# Patient Record
Sex: Male | Born: 1985 | Race: White | Hispanic: No | Marital: Single | Smoking: Current every day smoker
Health system: Southern US, Community
[De-identification: ages and names within clinical notes are randomized; demographics above are authoritative.]

## PROBLEM LIST (undated history)

## (undated) DIAGNOSIS — F1010 Alcohol abuse, uncomplicated: Secondary | ICD-10-CM

---

## 2014-04-30 ENCOUNTER — Encounter (HOSPITAL_COMMUNITY): Payer: Self-pay | Admitting: Emergency Medicine

## 2014-04-30 ENCOUNTER — Emergency Department (HOSPITAL_COMMUNITY)
Admission: EM | Admit: 2014-04-30 | Discharge: 2014-04-30 | Disposition: A | Discharge status: Home / Self Care | Payer: Self-pay | Attending: Emergency Medicine | Admitting: Emergency Medicine

## 2014-04-30 ENCOUNTER — Emergency Department (HOSPITAL_COMMUNITY): Payer: Self-pay

## 2014-04-30 DIAGNOSIS — R000 Tachycardia, unspecified: Secondary | ICD-10-CM | POA: Insufficient documentation

## 2014-04-30 DIAGNOSIS — R569 Unspecified convulsions: Secondary | ICD-10-CM | POA: Insufficient documentation

## 2014-04-30 DIAGNOSIS — F17200 Nicotine dependence, unspecified, uncomplicated: Secondary | ICD-10-CM | POA: Insufficient documentation

## 2014-04-30 HISTORY — DX: Alcohol abuse, uncomplicated: F10.10

## 2014-04-30 LAB — BASIC METABOLIC PANEL
Anion gap: 19 — ABNORMAL HIGH (ref 5–15)
BUN: 6 mg/dL (ref 6–23)
CO2: 20 mEq/L (ref 19–32)
Calcium: 9.3 mg/dL (ref 8.4–10.5)
Chloride: 99 mEq/L (ref 96–112)
Creatinine, Ser: 0.76 mg/dL (ref 0.50–1.35)
GFR calc Af Amer: >90 mL/min (ref >90)
GFR calc non Af Amer: >90 mL/min (ref >90)
Glucose, Bld: 96 mg/dL (ref 70–99)
Potassium: 3.8 mEq/L (ref 3.7–5.3)
Sodium: 138 mEq/L (ref 137–147)

## 2014-04-30 LAB — RAPID URINE DRUG SCREEN, HOSP PERFORMED
Amphetamines: NOT DETECTED
Barbiturates: NOT DETECTED
Benzodiazepines: NOT DETECTED
Cocaine: NOT DETECTED
Opiates: NOT DETECTED
Tetrahydrocannabinol: NOT DETECTED

## 2014-04-30 LAB — CBC WITH DIFFERENTIAL/PLATELET
Basophils Absolute: 0.1 10*3/uL (ref 0.0–0.1)
Basophils Relative: 1 % (ref 0–1)
Eosinophils Absolute: 0.4 10*3/uL (ref 0.0–0.7)
Eosinophils Relative: 4 % (ref 0–5)
HCT: 43.6 % (ref 39.0–52.0)
Hemoglobin: 15.1 g/dL (ref 13.0–17.0)
Lymphocytes Relative: 32 % (ref 12–46)
Lymphs Abs: 2.9 10*3/uL (ref 0.7–4.0)
MCH: 34.2 pg — ABNORMAL HIGH (ref 26.0–34.0)
MCHC: 34.6 g/dL (ref 30.0–36.0)
MCV: 98.9 fL (ref 78.0–100.0)
Monocytes Absolute: 0.6 10*3/uL (ref 0.1–1.0)
Monocytes Relative: 7 % (ref 3–12)
Neutro Abs: 5.2 10*3/uL (ref 1.7–7.7)
Neutrophils Relative %: 56 % (ref 43–77)
Platelets: 222 10*3/uL (ref 150–400)
RBC: 4.41 MIL/uL (ref 4.22–5.81)
RDW: 12 % (ref 11.5–15.5)
WBC: 9.2 10*3/uL (ref 4.0–10.5)

## 2014-04-30 LAB — ETHANOL: Alcohol, Ethyl (B): <11 mg/dL (ref 0–11)

## 2014-04-30 MED ORDER — SODIUM CHLORIDE 0.9 % IV BOLUS (SEPSIS)
1000.0000 mL | Freq: Once | INTRAVENOUS | Status: AC
  Administered 2014-04-30: 1000 mL via INTRAVENOUS

## 2014-04-30 MED ORDER — LORAZEPAM 2 MG/ML IJ SOLN
2.0000 mg | Freq: Once | INTRAMUSCULAR | Status: AC
  Administered 2014-04-30: 2 mg via INTRAVENOUS
  Filled 2014-04-30: qty 1

## 2014-04-30 NOTE — ED Notes (Signed)
Pt states [place] [employer] [place] and had a syncopal episode.  Pt has hematoma on left front forehead. Pt has abrasions to top of head and left lateral head. Pt is unaware of how long he was unconscious.  Pt denies urinary incontinence and has no lacerations or abrasions to tongue.  Pt denies SOB, dizziness and chest pain.  Neuro exam normal - grips equal.  PERRL.

## 2014-04-30 NOTE — ED Provider Notes (Signed)
CSN: [id number]     Arrival date & time 04/30/14  1307 History   First MD Initiated Contact with Patient 04/30/14 1324     Chief Complaint  Patient presents with  . Seizures     (Consider location/radiation/quality/duration/timing/severity/associated sxs/prior Treatment) HPI  28 year old male presenting from [place] local burger joint[place]bar. Apparently had a witnessed seizure there surely before arrival. Bystanders report falling from a standing position. Compared to be unconscious for a period of several minutes. He generalized shaking activity. Unclear if this began before or after his head struck the ground though. He seemed very confused and did not recognize his family members when he regained consciousness. He remained confused even as EMS was bringing him to the hospital. He is currently back to his baseline when I evaluated him. Complaining of mild headache. Denies any seizure history. Does have a long-standing history of alcohol abuse. Last drink was last night. No hallucinations. No blood thinners. No medications. Denies any drug use aside from occasional marijuana. No fever or chills. No acute numbness, tingling or focal loss of strength. States has been sleeping okay recently.  Past Medical History  Diagnosis Date  . Alcohol abuse    History reviewed. No pertinent past surgical history. No family history on file. History  Substance Use Topics  . Smoking status: Current Every Day Smoker  . Smokeless tobacco: Former Neurosurgeon  . Alcohol Use: Yes    Review of Systems  All systems reviewed and negative, other than as noted in HPI.   Allergies  Shellfish allergy  Home Medications   Prior to Admission medications   Not on File   BP 149/82  Pulse 126  Temp(Src) 99 F (37.2 C) (Oral)  Resp 20  SpO2 96% Physical Exam  Nursing note and vitals reviewed. Constitutional: He is oriented to person, place, and time. He appears well-developed and well-nourished. No distress.  Sitting  in bed. No acute distress.  HENT:  Head: Normocephalic and atraumatic.  Eyes: Conjunctivae are normal. Right eye exhibits no discharge. Left eye exhibits no discharge.  Neck: Neck supple.  Cardiovascular: Regular rhythm and normal heart sounds.  Exam reveals no gallop and no friction rub.   No murmur heard. Tachycardic  Pulmonary/Chest: Effort normal and breath sounds normal. No respiratory distress.  Abdominal: Soft. He exhibits no distension. There is no tenderness.  Musculoskeletal: He exhibits no edema and no tenderness.  Neurological: He is alert and oriented to person, place, and time. No cranial nerve deficit. He exhibits normal muscle tone. Coordination normal.  Speech clear. Content appropriate. Holes commands. Mildly tremulous. No pass pointing with finger nose testing bilaterally. Gait is steady.  Skin: Skin is warm and dry. He is not diaphoretic.  Psychiatric: He has a normal mood and affect. His behavior is normal. Thought content normal.    ED Course  Procedures (including critical care time) Labs Review Labs Reviewed  CBC WITH DIFFERENTIAL - Abnormal; Notable for the following:    MCH 34.2 (*)    All other components within normal limits  BASIC METABOLIC PANEL - Abnormal; Notable for the following:    Anion gap 19 (*)    All other components within normal limits  ETHANOL  URINE RAPID DRUG SCREEN (HOSP PERFORMED)    Imaging Review No results found.  Ct Head Wo Contrast  04/30/2014   CLINICAL DATA:  Syncopal episode, fall, head injury  EXAM: CT HEAD WITHOUT CONTRAST  TECHNIQUE: Contiguous axial images were obtained from the base of the skull  through the vertex without contrast.  COMPARISON:  None  FINDINGS: Normal appearance of the intracranial structures. No evidence for acute hemorrhage, mass lesion, midline shift, hydrocephalus or large infarct. No acute bony abnormality. The visualized sinuses are clear.  IMPRESSION: No acute intracranial abnormality.    Electronically Signed   By: Ruel Favorsrevor  Shick M.D.   On: 04/30/2014 15:34     EKG Interpretation   Date/Time:  Saturday April 30 2014 13:15:12 EDT Ventricular Rate:  127 PR Interval:  149 QRS Duration: 87 QT Interval:  318 QTC Calculation: 462 R Axis:   101 Text Interpretation:  Sinus tachycardia Probable left atrial enlargement  Borderline right axis deviation ED PHYSICIAN INTERPRETATION AVAILABLE IN  [facility] Confirmed by TEST, Record ([id number]) on 05/02/2014 7:50:17 AM      MDM   Final diagnoses:  Seizure    28 year old male with likely seizure. Patient has a history of alcohol abuse. He seems to try to downplay this. "I stopped drinking liquor." He does drink because of 5-6 beers on a daily basis. Last drink was last night. My initial valuation used alert oriented. Acting appropriately. Has mildly tremulous. Tachycardic and hypertension consistent with alcohol withdrawal. His neuro exam was nonfocal though. His workup was pretty unremarkable. Recommended admission for observation the patient is declining. He has medical decision-making capability. Thoroughly discussed my concerns for potential recurrent seizure. Return cautions were discussed. [place]. Encouraged to seek care for alcohol abuse he returns home.  Raeford RazorStephen Kodi Guerrera, MD 05/04/14 1425

## 2014-04-30 NOTE — ED Notes (Signed)
Per EMS - pt comes from [employer] with EMS.  [place].  Pt was found on floor at restaurant and does not remember how he got there.  Pt was with family and state he has a hx of seizures, but patient denies.  Pt states he has hx of syncopal episodes.  Pt denies use of alcohol today - but family told EMS that he has hx of alcohol abuse.  Vitals BP 172/86, HR 147, RR 22.  CBG 81.  Pt was disoriented on scene initially - didn't know day of week and couldn't remember where he lived.

## 2014-04-30 NOTE — Discharge Instructions (Signed)

## 2016-03-02 IMAGING — CT CT HEAD W/O CM
2 series · 17 of 30 positions shown, 20 images · non-contrast
Comparison: None

CLINICAL DATA: Syncopal episode, fall, head injury

EXAM:
CT HEAD WITHOUT CONTRAST
TECHNIQUE: Contiguous axial images were obtained from the base of the skull
through the vertex without contrast.

[Series 2: head w/o · axial · non-contrast · 0.45mm/px · z∈[+139,+259]mm · 9 of 32 slices shown, 12 images]
[im 4/32  brain]
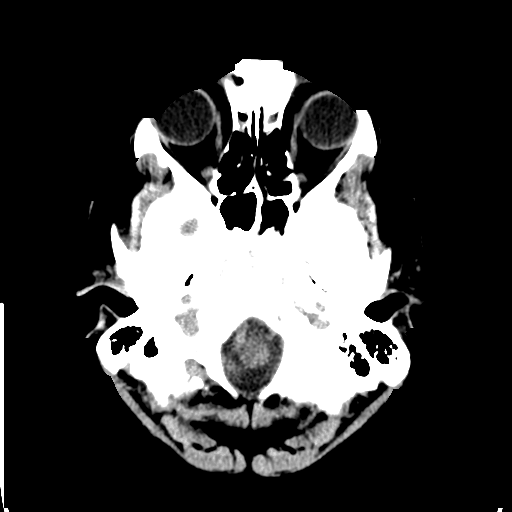
[im 4/32  bone]
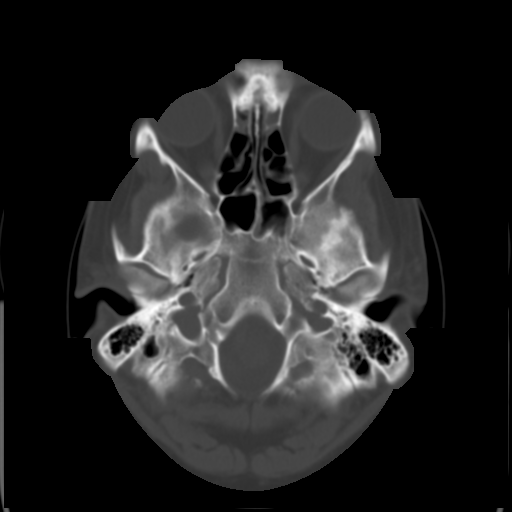
[im 7/32  brain]
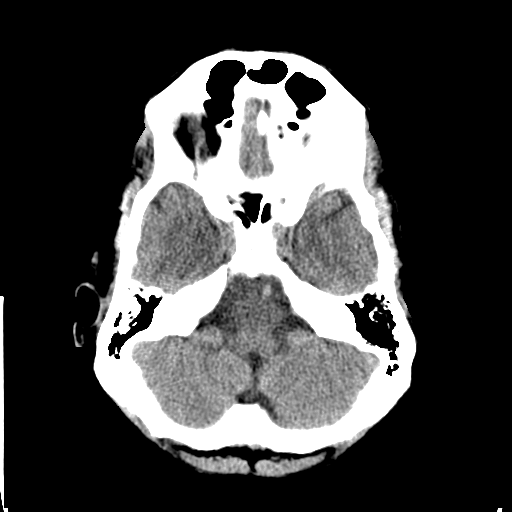
[im 10/32  brain]
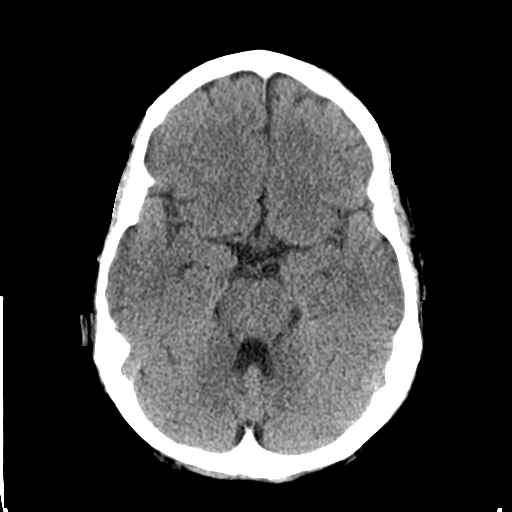
[im 13/32  brain]
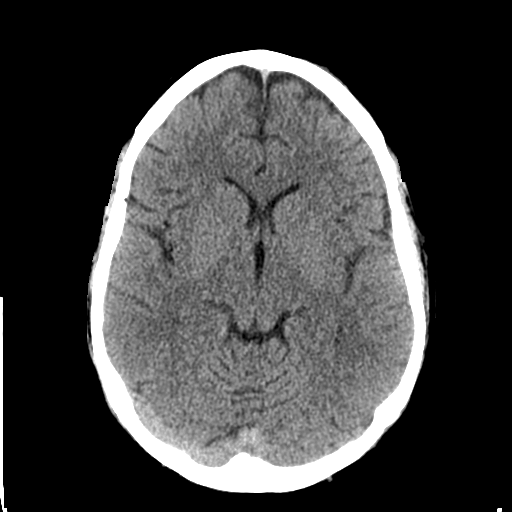
[im 16/32  brain]
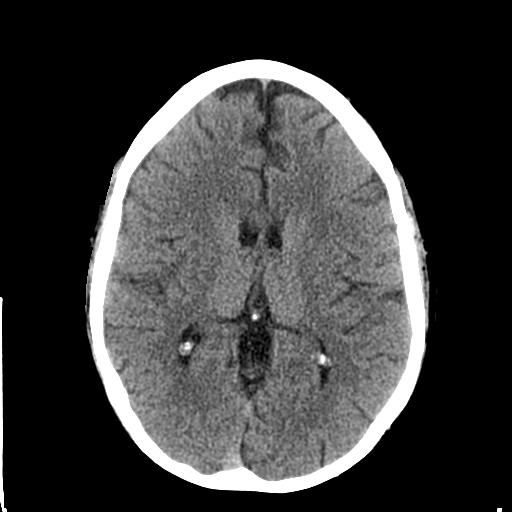
[im 16/32  bone]
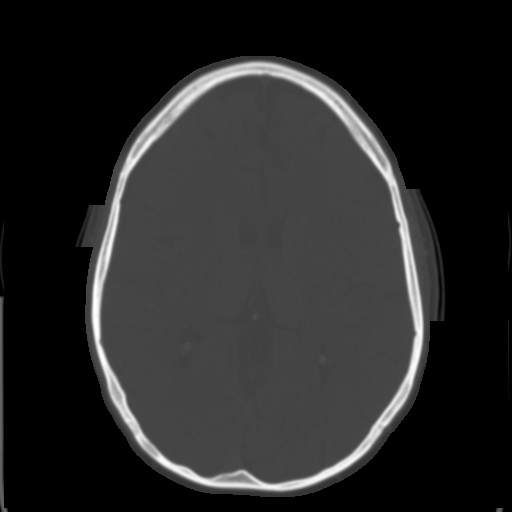
[im 19/32  brain]
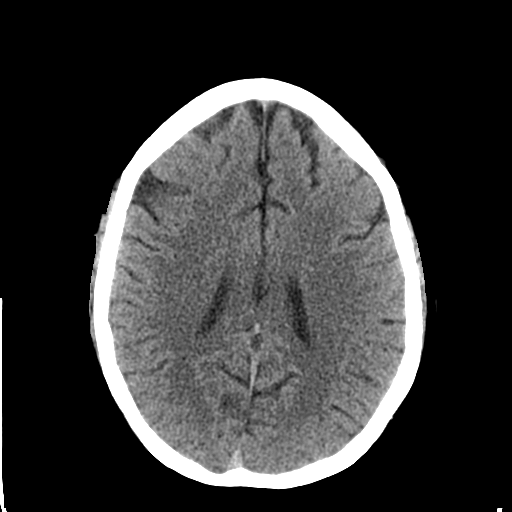
[im 22/32  brain]
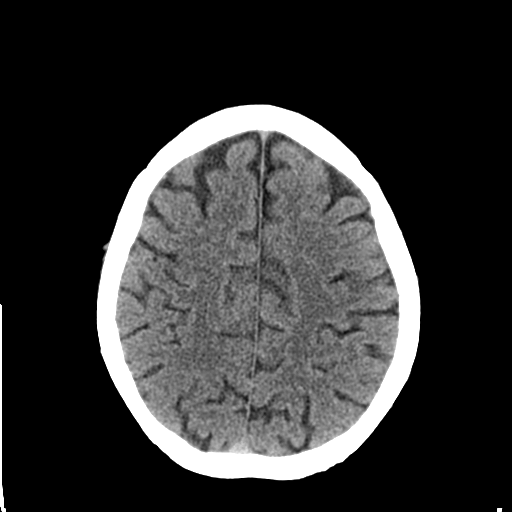
[im 25/32  brain]
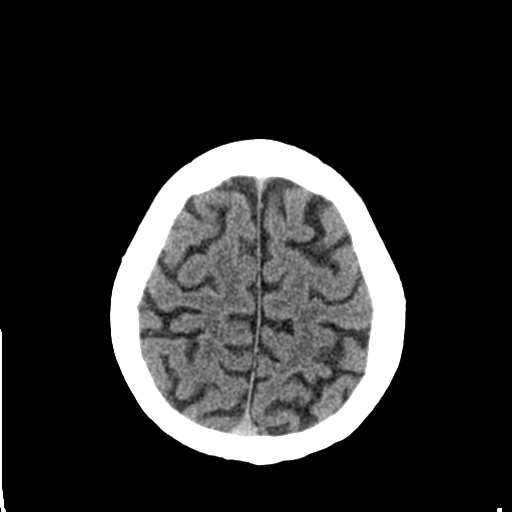
[im 28/32  brain]
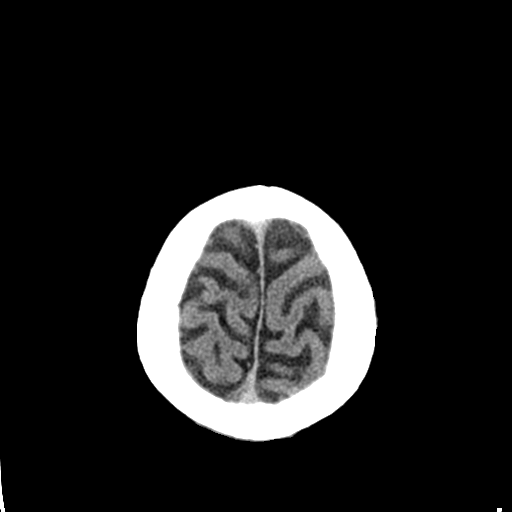
[im 28/32  bone]
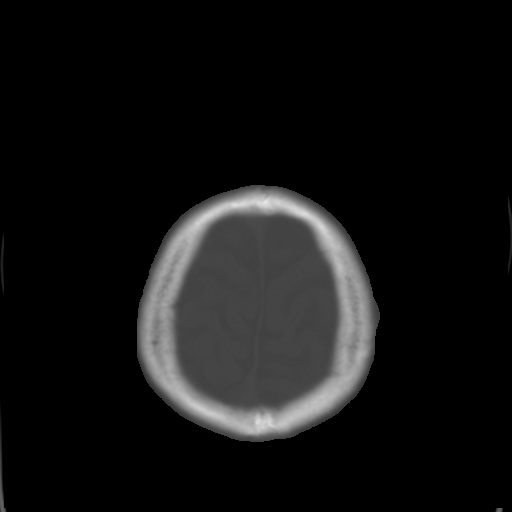

[Series 3: bone windows · axial · 0.45mm/px · z∈[+139,+259]mm · 8 of 52 slices shown]
[im 6/52  bone]
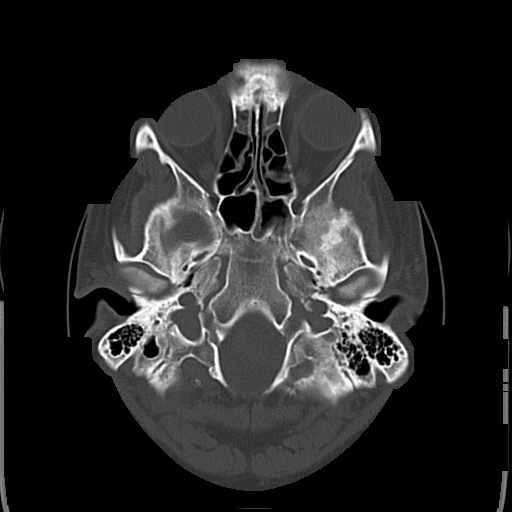
[im 12/52  bone]
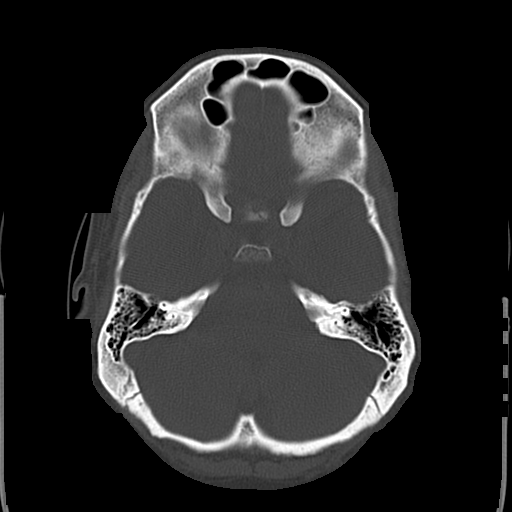
[im 18/52  bone]
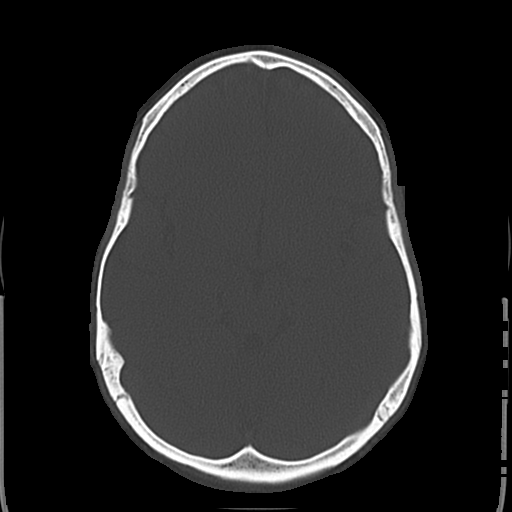
[im 23/52  bone]
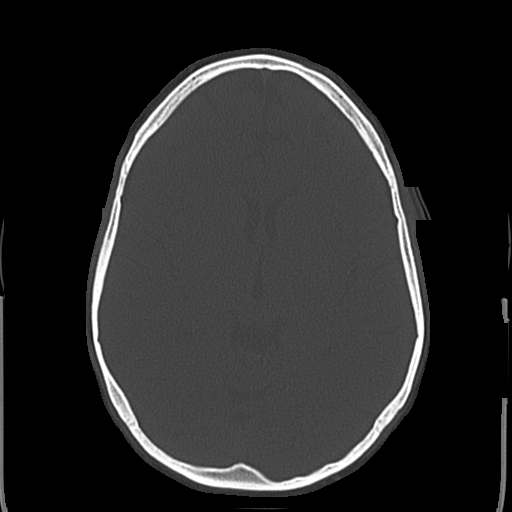
[im 29/52  bone]
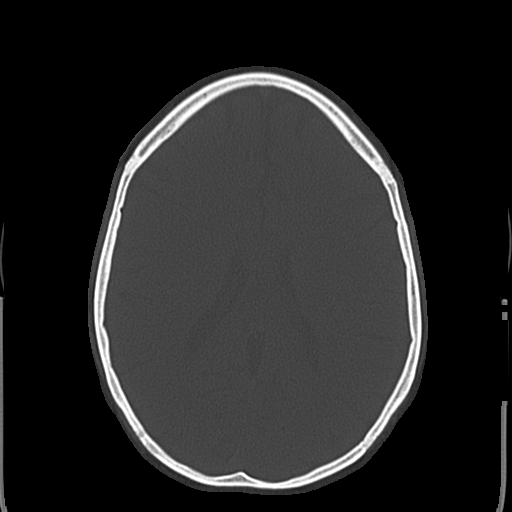
[im 35/52  bone]
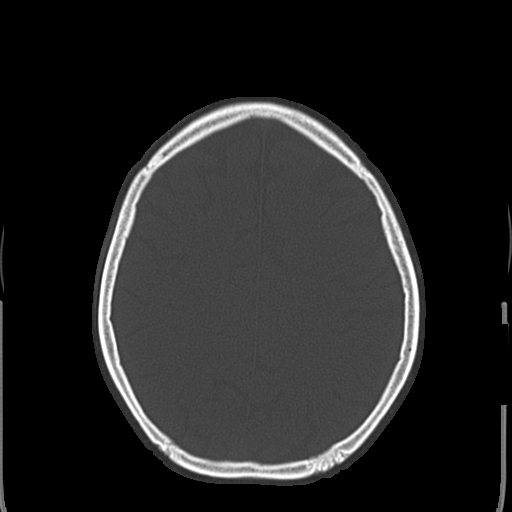
[im 40/52  bone]
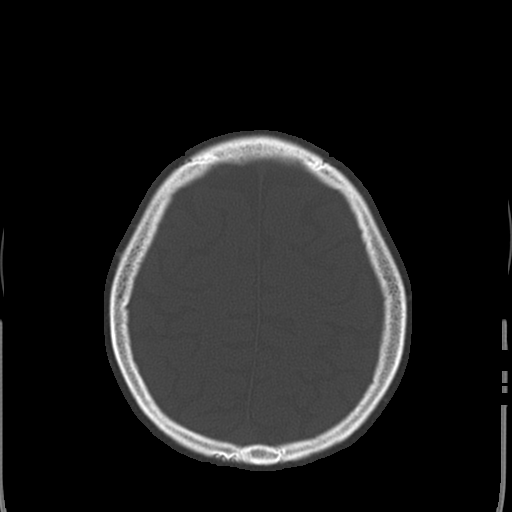
[im 46/52  bone]
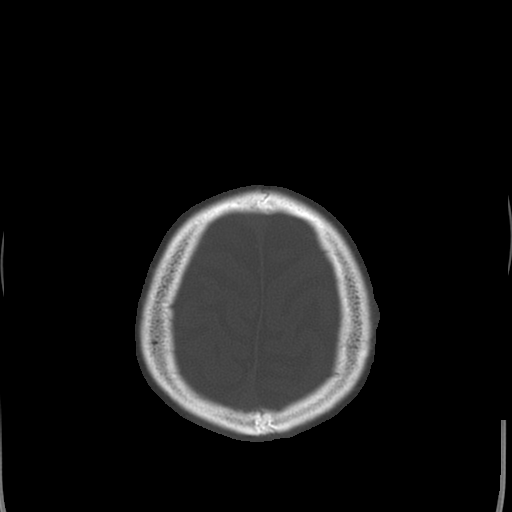

[17 of 30 positions shown; findings below may reference images not displayed]

FINDINGS: Normal appearance of the intracranial structures. No evidence for
acute hemorrhage, mass lesion, midline shift, hydrocephalus or large
infarct. No acute bony abnormality. The visualized sinuses are
clear.
IMPRESSION: No acute intracranial abnormality.
